# Patient Record
Sex: Female | Born: 1955 | Race: White | Hispanic: No | Marital: Married | Smoking: Never smoker
Health system: Southern US, Community
[De-identification: ages and names within clinical notes are randomized; demographics above are authoritative.]

---

## 2000-08-28 ENCOUNTER — Other Ambulatory Visit: Admission: RE | Admit: 2000-08-28 | Discharge: 2000-08-28 | Payer: Self-pay | Admitting: Internal Medicine

## 2000-09-02 ENCOUNTER — Ambulatory Visit (HOSPITAL_COMMUNITY): Admission: RE | Admit: 2000-09-02 | Discharge: 2000-09-02 | Payer: Self-pay | Admitting: Internal Medicine

## 2000-09-02 ENCOUNTER — Encounter: Payer: Self-pay | Admitting: Internal Medicine

## 2001-12-29 ENCOUNTER — Encounter: Payer: Self-pay | Admitting: Internal Medicine

## 2001-12-29 ENCOUNTER — Encounter: Admission: RE | Admit: 2001-12-29 | Discharge: 2001-12-29 | Payer: Self-pay | Admitting: Internal Medicine

## 2002-04-01 ENCOUNTER — Other Ambulatory Visit: Admission: RE | Admit: 2002-04-01 | Discharge: 2002-04-01 | Payer: Self-pay | Admitting: Internal Medicine

## 2002-07-09 ENCOUNTER — Ambulatory Visit (HOSPITAL_COMMUNITY): Admission: RE | Admit: 2002-07-09 | Discharge: 2002-07-09 | Payer: Self-pay | Admitting: Gastroenterology

## 2003-02-11 ENCOUNTER — Ambulatory Visit (HOSPITAL_COMMUNITY): Admission: RE | Admit: 2003-02-11 | Discharge: 2003-02-11 | Payer: Self-pay | Admitting: Internal Medicine

## 2004-01-09 ENCOUNTER — Other Ambulatory Visit: Admission: RE | Admit: 2004-01-09 | Discharge: 2004-01-09 | Payer: Self-pay | Admitting: Internal Medicine

## 2004-04-03 ENCOUNTER — Encounter (INDEPENDENT_AMBULATORY_CARE_PROVIDER_SITE_OTHER): Payer: Self-pay | Admitting: Specialist

## 2004-04-03 ENCOUNTER — Ambulatory Visit (HOSPITAL_COMMUNITY): Admission: RE | Admit: 2004-04-03 | Discharge: 2004-04-03 | Payer: Self-pay | Admitting: Obstetrics and Gynecology

## 2004-05-01 ENCOUNTER — Ambulatory Visit (HOSPITAL_COMMUNITY): Admission: RE | Admit: 2004-05-01 | Discharge: 2004-05-01 | Payer: Self-pay | Admitting: Internal Medicine

## 2005-06-03 ENCOUNTER — Ambulatory Visit (HOSPITAL_COMMUNITY): Admission: RE | Admit: 2005-06-03 | Discharge: 2005-06-03 | Payer: Self-pay | Admitting: Internal Medicine

## 2006-01-28 ENCOUNTER — Other Ambulatory Visit: Admission: RE | Admit: 2006-01-28 | Discharge: 2006-01-28 | Payer: Self-pay | Admitting: Family Medicine

## 2006-06-23 ENCOUNTER — Ambulatory Visit (HOSPITAL_COMMUNITY): Admission: RE | Admit: 2006-06-23 | Discharge: 2006-06-23 | Payer: Self-pay | Admitting: Family Medicine

## 2007-12-14 ENCOUNTER — Encounter: Admission: RE | Admit: 2007-12-14 | Discharge: 2007-12-14 | Payer: Self-pay | Admitting: Family Medicine

## 2009-02-07 ENCOUNTER — Encounter: Admission: RE | Admit: 2009-02-07 | Discharge: 2009-02-07 | Payer: Self-pay | Admitting: Family Medicine

## 2009-07-17 ENCOUNTER — Other Ambulatory Visit: Admission: RE | Admit: 2009-07-17 | Discharge: 2009-07-17 | Payer: Self-pay | Admitting: Family Medicine

## 2010-07-27 NOTE — Op Note (Signed)
Rhonda Molina, Rhonda Molina                 ACCOUNT NO.:  [id number]   MEDICAL RECORD NO.:  [id number]          PATIENT TYPE:  AMB   LOCATION:  SDC                           FACILITY:  [facility]   PHYSICIAN:  Charles A. Delcambre, MDDATE OF BIRTH:  February 15, 1956   DATE OF PROCEDURE:  04/03/2004  DATE OF DISCHARGE:                                 OPERATIVE REPORT   PREOPERATIVE DIAGNOSES:  1.  Menorrhagia.  2.  Endometrial polyps.   POSTOPERATIVE DIAGNOSES:  1.  Menorrhagia.  2.  Endometrial polyps.   PROCEDURES:  1.  Paracervical block.  2.  Hysteroscopy.  3.  Dilation and curettage.  4.  Polypectomy.   SURGEON:  Charles A. Sydnee Cabal, M.D.   ASSISTANT:  None.   COMPLICATIONS:  None.   ESTIMATED BLOOD LOSS:  Less than 25 cc.   ANESTHESIA:  Monitored anesthesia care with IV sedation.   SPECIMENS:  1.  Endometrial polyp.  2.  Endometrial curettings.   INSTRUMENT, SPONGE, AND NEEDLE COUNT:  Correct x 2.   OPERATIVE FINDINGS:  Fundal polyp.   SORBITOL LOSS DURING THE PROCEDURE:  20 cc.   Patient to PACU stable.   DESCRIPTION OF PROCEDURE:  The patient was taken to the operating room and  placed in the supine position.  Sedation by IV was undertaken without  difficulty.  A weighted speculum was placed in the vagina.  A tenaculum was  placed on the anterior lip of the cervix.  0.25% Marcaine a total of 20 cc  divided evenly at 4 and 7 o'clock was placed.  No evidence of intravascular  injection was noted.  A sound was noted to 10 cm.  No evidence of  perforation  was noted.  Hagar dilators were used to dilate enough to pass a 5 mm  hysteroscope.  Operative findings were noted.  Polyp forceps were used to  remove the polyp without difficulty.  Generalized curettings were done with  a small banjo curet.  The procedure was terminated.  The patient was taken  to the PACU with physician in attendance.      CAD/MEDQ  D:  04/03/2004  T:  04/03/2004  Job:  [id number]

## 2010-07-27 NOTE — H&P (Signed)
Rhonda Molina, WILLETTS                 ACCOUNT NO.:  [id number]   MEDICAL RECORD NO.:  [id number]          PATIENT TYPE:  AMB   LOCATION:  SDC                           FACILITY:  [facility]   PHYSICIAN:  Charles A. Delcambre, MDDATE OF BIRTH:  1956/01/06   DATE OF ADMISSION:  04/03/2004  DATE OF DISCHARGE:                                HISTORY & PHYSICAL   HISTORY OF PRESENT ILLNESS:  This patient is to be admitted on April 03, 2004, for hysteroscopy and D&C for endometrial polyps to same-day surgery.  She is a 55 year old, para 2-0-0-2, with irregular periods, heavy passage of  clots and heavy bleeding for two months.  Endometrial biopsy was  proliferative endometrium.  Sonohysterogram showed multiple endometrial  defects consistent with multiple endometrial polyps, 12 x 7 mm, 8 mm and 9  mm.  Thick endometrium was noted.  On the anterior 4.5 mm and posterior 4.1  mm defects were noted.   PAST MEDICAL HISTORY:  Migraine headaches.   PAST SURGICAL HISTORY:  Knee surgery.   MEDICATIONS:  Aleve and Excedrin for migraines.   ALLERGIES:  No known drug allergies.   SOCIAL HISTORY:  Occasional social alcohol.  No tobacco or drug use.  STD  exposure in the past.  Married.  Monogamous relationship with her husband.   FAMILY HISTORY:  Father deceased at 65 of heart disease and diabetes.  Mother 72 with colon cancer onset in her 9s.  A brother, 17, in good  health. Otherwise negative.   REVIEW OF SYSTEMS:  Heavy bleeding and irregular menses as noted.  No fever,  chills, rashes, lesions, headaches, dizziness, seasonal allergies, chest  pain, shortness of breath, wheezing, diarrhea, constipation, bleeding,  melena, hematochezia, urgency, frequency, dysuria, incontinence, hematuria,  hematuria, galactorrhea or emotional changes.   PHYSICAL EXAMINATION:  GENERAL APPEARANCE:  Alert and oriented x 3.  No  distress.  VITAL SIGNS:  Blood pressure 110/80, respirations 16, heart rate 80.  WEIGHT:   207 pounds.  HEENT:  Grossly within normal limits.  NECK:  Supple without thyromegaly or adenopathy.  LUNGS:  Clear bilaterally.  HEART:  Regular rate and rhythm without murmur, rub or gallop.  BREASTS:  Symmetrical on exam.  Negative PCP within the last six months.  ABDOMEN:  Soft, flat and nontender.  PELVIC:  Normal external female genitalia.  Bartholin, urethral and Skene's  glands within normal limits.  Vault without discharge or lesions.  Sound on  biopsy was 9 cm.  The uterus was otherwise not enlarged.  Adnexa nontender  without masses bilaterally.  On ultrasound, the ovaries appeared normal  bilaterally.   ASSESSMENT:  1.  Menorrhagia.  2.  Endometrial polyps.   PLAN:  Hysteroscopy and D&C.  The patient was aware of the risks of uterine  perforation, bowel and bladder damage, blood product risks, including  hepatitis and HIV, transfusion risks, sorbitol fluid overload, electrolyte  imbalance and anesthesia risks in general.  All questions were answered.  She gives informed consent.  She will remain NPO past midnight.  Preoperative serum hCG and CBC will be done.  Of note, birth control method  is vasectomy.  All questions were answered.  We will proceed as outlined.      CAD/MEDQ  D:  03/27/2004  T:  03/27/2004  Job:  [id number]

## 2010-07-27 NOTE — Op Note (Signed)
   Rhonda Molina, Rhonda Molina                           ACCOUNT NO.:  [id number]   MEDICAL RECORD NO.:  [id number]                   PATIENT TYPE:  AMB   LOCATION:  ENDO                                 FACILITY:  [facility]   PHYSICIAN:  James L. Malon Kindle., M.D.          DATE OF BIRTH:  1955/08/26   DATE OF PROCEDURE:  07/09/2002  DATE OF DISCHARGE:                                 OPERATIVE REPORT   PROCEDURE:  Colonoscopy.   MEDICATIONS:  Fentanyl 70 mcg, Versed 10 mg IV.   INSTRUMENT USED:  Olympus pediatric scope.   INDICATIONS FOR PROCEDURE:  A 55 year old whose mother developed colon  cancer in her early 75's.  The patient has had screening colonoscopies in  the past, but since it is now less than 10 years age difference since her  mother developed cancer, we are doing a baseline screening colonoscopy.   DESCRIPTION OF PROCEDURE:  The procedure had been explained to the procedure  and consent obtained. With the patient in the left lateral decubitus  position, the Olympus scope was inserted and advanced.  The Olympus  pediatric scope was used and we were able to advance to the cecum using  abdominal pressure and position change.  The ileocecal valve was seen and  entered for a short distance.  Terminal ileum was normal. The cecum,  ascending colon, hepatic flexure, transverse, descending, and sigmoid colon  were all seen well and no polyps were seen. There was no significant  diverticulosis. The scope was withdrawn to the rectum and the rectum was  free of polyps.  There were internal hemorrhoids. The scope was withdrawn  and the patient tolerated the procedure well.   ASSESSMENT:  1. Normal colonoscopy without polyps.  2. Internal hemorrhoids.   PLAN:  Will recommend repeating in five years with yearly hemoccults and  hemorrhoid instruction sheet.                                               James L. Malon Kindle., M.D.    Waldron Session  D:  07/09/2002  T:  07/09/2002  Job:   [id number]   cc:   Marcene Duos, M.D.  [address] [place]  [place] [address]  Fax: [phone]

## 2010-12-05 ENCOUNTER — Other Ambulatory Visit: Payer: Self-pay | Admitting: Family Medicine

## 2010-12-05 DIAGNOSIS — Z1231 Encounter for screening mammogram for malignant neoplasm of breast: Secondary | ICD-10-CM

## 2010-12-17 ENCOUNTER — Ambulatory Visit
Admission: RE | Admit: 2010-12-17 | Discharge: 2010-12-17 | Disposition: A | Discharge status: Home / Self Care | Payer: 59 | Source: Ambulatory Visit | Attending: Family Medicine | Admitting: Family Medicine

## 2010-12-17 DIAGNOSIS — Z1231 Encounter for screening mammogram for malignant neoplasm of breast: Secondary | ICD-10-CM

## 2011-11-29 ENCOUNTER — Other Ambulatory Visit: Payer: Self-pay | Admitting: Family Medicine

## 2011-11-29 DIAGNOSIS — Z1231 Encounter for screening mammogram for malignant neoplasm of breast: Secondary | ICD-10-CM

## 2011-12-24 ENCOUNTER — Ambulatory Visit
Admission: RE | Admit: 2011-12-24 | Discharge: 2011-12-24 | Disposition: A | Discharge status: Home / Self Care | Payer: 59 | Source: Ambulatory Visit | Attending: Family Medicine | Admitting: Family Medicine

## 2011-12-24 DIAGNOSIS — Z1231 Encounter for screening mammogram for malignant neoplasm of breast: Secondary | ICD-10-CM

## 2013-12-17 ENCOUNTER — Other Ambulatory Visit: Payer: Self-pay

## 2013-12-17 DIAGNOSIS — Z1239 Encounter for other screening for malignant neoplasm of breast: Secondary | ICD-10-CM

## 2013-12-28 ENCOUNTER — Ambulatory Visit
Admission: RE | Admit: 2013-12-28 | Discharge: 2013-12-28 | Disposition: A | Discharge status: Home / Self Care | Payer: 59 | Source: Ambulatory Visit

## 2013-12-28 DIAGNOSIS — Z1239 Encounter for other screening for malignant neoplasm of breast: Secondary | ICD-10-CM

## 2014-01-03 ENCOUNTER — Other Ambulatory Visit: Payer: Self-pay | Admitting: Family Medicine

## 2014-01-03 DIAGNOSIS — R928 Other abnormal and inconclusive findings on diagnostic imaging of breast: Secondary | ICD-10-CM

## 2014-01-17 ENCOUNTER — Ambulatory Visit
Admission: RE | Admit: 2014-01-17 | Discharge: 2014-01-17 | Disposition: A | Discharge status: Home / Self Care | Payer: 59 | Source: Ambulatory Visit | Attending: Family Medicine | Admitting: Family Medicine

## 2014-01-17 DIAGNOSIS — R928 Other abnormal and inconclusive findings on diagnostic imaging of breast: Secondary | ICD-10-CM

## 2014-12-13 ENCOUNTER — Other Ambulatory Visit: Payer: Self-pay

## 2014-12-13 DIAGNOSIS — Z1231 Encounter for screening mammogram for malignant neoplasm of breast: Secondary | ICD-10-CM

## 2014-12-30 ENCOUNTER — Ambulatory Visit
Admission: RE | Admit: 2014-12-30 | Discharge: 2014-12-30 | Disposition: A | Discharge status: Home / Self Care | Payer: Commercial Managed Care - HMO | Source: Ambulatory Visit

## 2014-12-30 DIAGNOSIS — Z1231 Encounter for screening mammogram for malignant neoplasm of breast: Secondary | ICD-10-CM

## 2015-01-03 ENCOUNTER — Ambulatory Visit (HOSPITAL_COMMUNITY)
Admission: RE | Admit: 2015-01-03 | Discharge: 2015-01-03 | Disposition: A | Discharge status: Home / Self Care | Payer: Commercial Managed Care - HMO | Source: Ambulatory Visit | Attending: Family Medicine | Admitting: Family Medicine

## 2015-01-03 ENCOUNTER — Other Ambulatory Visit (HOSPITAL_COMMUNITY): Payer: Self-pay | Admitting: Family Medicine

## 2015-01-03 DIAGNOSIS — M7989 Other specified soft tissue disorders: Secondary | ICD-10-CM

## 2015-01-03 DIAGNOSIS — M79604 Pain in right leg: Secondary | ICD-10-CM

## 2015-01-03 DIAGNOSIS — M79661 Pain in right lower leg: Secondary | ICD-10-CM | POA: Diagnosis present

## 2015-01-03 NOTE — Progress Notes (Addendum)
Preliminary results by tech - Right Lower ext duplex completed. Negative for deep and superficial vein thrombosis in the right lower extremity. A small Baker's cyst was noted in the right popliteal fossa. Left message with Dr. Chase CallerSun's office at 4.50 pm. Marilynne Halstedita Ryn Peine, BS, RDMS, RVT

## 2015-10-18 ENCOUNTER — Other Ambulatory Visit: Payer: Self-pay | Admitting: Family Medicine

## 2015-10-18 ENCOUNTER — Other Ambulatory Visit (HOSPITAL_COMMUNITY)
Admission: RE | Admit: 2015-10-18 | Discharge: 2015-10-18 | Disposition: A | Discharge status: Home / Self Care | Payer: Commercial Managed Care - HMO | Source: Ambulatory Visit | Attending: Family Medicine | Admitting: Family Medicine

## 2015-10-18 DIAGNOSIS — Z124 Encounter for screening for malignant neoplasm of cervix: Secondary | ICD-10-CM | POA: Insufficient documentation

## 2015-10-18 DIAGNOSIS — Z1151 Encounter for screening for human papillomavirus (HPV): Secondary | ICD-10-CM | POA: Diagnosis present

## 2015-10-19 LAB — CYTOLOGY - PAP

## 2015-12-28 ENCOUNTER — Other Ambulatory Visit: Payer: Self-pay | Admitting: Family Medicine

## 2015-12-28 DIAGNOSIS — Z1231 Encounter for screening mammogram for malignant neoplasm of breast: Secondary | ICD-10-CM

## 2016-01-11 ENCOUNTER — Ambulatory Visit
Admission: RE | Admit: 2016-01-11 | Discharge: 2016-01-11 | Disposition: A | Discharge status: Home / Self Care | Payer: Commercial Managed Care - HMO | Source: Ambulatory Visit | Attending: Family Medicine | Admitting: Family Medicine

## 2016-01-11 DIAGNOSIS — Z1231 Encounter for screening mammogram for malignant neoplasm of breast: Secondary | ICD-10-CM

## 2016-03-19 DIAGNOSIS — K644 Residual hemorrhoidal skin tags: Secondary | ICD-10-CM | POA: Diagnosis not present

## 2016-03-19 DIAGNOSIS — Z1211 Encounter for screening for malignant neoplasm of colon: Secondary | ICD-10-CM | POA: Diagnosis not present

## 2016-03-19 DIAGNOSIS — Z800 Family history of malignant neoplasm of digestive organs: Secondary | ICD-10-CM | POA: Diagnosis not present

## 2016-03-20 DIAGNOSIS — J209 Acute bronchitis, unspecified: Secondary | ICD-10-CM | POA: Diagnosis not present

## 2016-09-20 ENCOUNTER — Encounter: Payer: Self-pay | Admitting: Family Medicine

## 2016-09-20 ENCOUNTER — Ambulatory Visit (INDEPENDENT_AMBULATORY_CARE_PROVIDER_SITE_OTHER): Payer: 59 | Admitting: Family Medicine

## 2016-09-20 VITALS — BP 126/76 | Ht 67.0 in | Wt 200.0 lb

## 2016-09-20 DIAGNOSIS — R269 Unspecified abnormalities of gait and mobility: Secondary | ICD-10-CM | POA: Diagnosis not present

## 2016-09-20 DIAGNOSIS — M79672 Pain in left foot: Secondary | ICD-10-CM | POA: Diagnosis not present

## 2016-09-20 DIAGNOSIS — M79671 Pain in right foot: Secondary | ICD-10-CM

## 2016-09-20 DIAGNOSIS — M7742 Metatarsalgia, left foot: Secondary | ICD-10-CM | POA: Diagnosis not present

## 2016-09-20 DIAGNOSIS — M2040 Other hammer toe(s) (acquired), unspecified foot: Secondary | ICD-10-CM | POA: Diagnosis not present

## 2016-09-20 DIAGNOSIS — M7741 Metatarsalgia, right foot: Secondary | ICD-10-CM | POA: Diagnosis not present

## 2016-09-20 NOTE — Progress Notes (Signed)
    CHIEF COMPLAINT / HPI: Bilateral foot pain long-standing. Biggest problem is left heel. Medial plantar surface pain with extended periods of walking standing etc. Had received some orthotics from a podiatrist a few years ago and is really helped but does have worn out. Pain is worse if she stands for more than about half an hour. Also pain with walking. Aching in nature 4-6 out of 10. Worse on the left foot bilateral. The orthotics helped. Rest helps.  REVIEW OF SYSTEMS:  No unusual weight change, no fever or chills. No skin rash on the feet. Has noted no specific numbness or tingling in the feet. No other unusual arthralgias or myalgias.  OBJECTIVE:  Vital signs are reviewed.   GEN.: Well-developed female no acute distress FEET: She has hammertoe right great toe and little toe. Left second toe partially hammered area beginning bunion formation bilateral or striae right greater than left. Wide forefoot with transverse arch loss. He'll tender to palpation at the origin the plantar fascia bilaterally. GAIT: Normal stride length. Out toeing on the left. Of pronation on the right.  Patient was fitted for a : standard, cushioned, semi-rigid orthotic. The orthotic was heated, placed on the orthotic stand. The patient was positioned in subtalar neutral position and 10 degrees of ankle dorsiflexion in a weight bearing stance on the heated orthotic blank After completion of molding, a stable base was applied to the orthotic blank. The blank was ground to a stable position for weight bearing. Blank: Red foam Base: White F 4 Posting: None   Face to face time spent in evaluation of foot pathology,gait abnormalities,counseling regarding foot / ankle and gait issues,effect of foot on on extremities,  discussion of therapeutic options, measurement and manufacture of custom molded orthotic was 45 minutes.   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 She also has some contribution of plantar fasciitis on the left heel. Bilaterally she has several hammertoes. To address these we made custom molded orthotics as above. At some point she may need addition of metatarsal pads as she has significant metatarsal flattening related to her transverse arch loss. She'll follow-up when necessary.

## 2016-12-09 ENCOUNTER — Other Ambulatory Visit: Payer: Self-pay | Admitting: Family Medicine

## 2016-12-09 DIAGNOSIS — Z1231 Encounter for screening mammogram for malignant neoplasm of breast: Secondary | ICD-10-CM

## 2016-12-18 DIAGNOSIS — Z23 Encounter for immunization: Secondary | ICD-10-CM | POA: Diagnosis not present

## 2017-01-13 ENCOUNTER — Ambulatory Visit
Admission: RE | Admit: 2017-01-13 | Discharge: 2017-01-13 | Disposition: A | Discharge status: Home / Self Care | Payer: 59 | Source: Ambulatory Visit | Attending: Family Medicine | Admitting: Family Medicine

## 2017-01-13 DIAGNOSIS — Z1231 Encounter for screening mammogram for malignant neoplasm of breast: Secondary | ICD-10-CM

## 2017-04-28 DIAGNOSIS — Z13220 Encounter for screening for lipoid disorders: Secondary | ICD-10-CM | POA: Diagnosis not present

## 2017-04-28 DIAGNOSIS — Z0000 Encounter for general adult medical examination without abnormal findings: Secondary | ICD-10-CM | POA: Diagnosis not present

## 2017-04-28 DIAGNOSIS — K219 Gastro-esophageal reflux disease without esophagitis: Secondary | ICD-10-CM | POA: Diagnosis not present

## 2017-04-28 DIAGNOSIS — E559 Vitamin D deficiency, unspecified: Secondary | ICD-10-CM | POA: Diagnosis not present

## 2017-04-28 DIAGNOSIS — Z79899 Other long term (current) drug therapy: Secondary | ICD-10-CM | POA: Diagnosis not present

## 2017-06-17 DIAGNOSIS — M8588 Other specified disorders of bone density and structure, other site: Secondary | ICD-10-CM | POA: Diagnosis not present

## 2017-07-17 DIAGNOSIS — S70369A Insect bite (nonvenomous), unspecified thigh, initial encounter: Secondary | ICD-10-CM | POA: Diagnosis not present

## 2018-01-01 DIAGNOSIS — Z23 Encounter for immunization: Secondary | ICD-10-CM | POA: Diagnosis not present

## 2018-01-07 ENCOUNTER — Other Ambulatory Visit: Payer: Self-pay | Admitting: Family Medicine

## 2018-01-07 DIAGNOSIS — Z1231 Encounter for screening mammogram for malignant neoplasm of breast: Secondary | ICD-10-CM

## 2018-02-18 ENCOUNTER — Ambulatory Visit
Admission: RE | Admit: 2018-02-18 | Discharge: 2018-02-18 | Disposition: A | Discharge status: Home / Self Care | Payer: 59 | Source: Ambulatory Visit | Attending: Family Medicine | Admitting: Family Medicine

## 2018-02-18 DIAGNOSIS — Z1231 Encounter for screening mammogram for malignant neoplasm of breast: Secondary | ICD-10-CM | POA: Diagnosis not present

## 2018-04-30 DIAGNOSIS — K219 Gastro-esophageal reflux disease without esophagitis: Secondary | ICD-10-CM | POA: Diagnosis not present

## 2018-04-30 DIAGNOSIS — Z79899 Other long term (current) drug therapy: Secondary | ICD-10-CM | POA: Diagnosis not present

## 2018-04-30 DIAGNOSIS — Z1159 Encounter for screening for other viral diseases: Secondary | ICD-10-CM | POA: Diagnosis not present

## 2018-04-30 DIAGNOSIS — Z0000 Encounter for general adult medical examination without abnormal findings: Secondary | ICD-10-CM | POA: Diagnosis not present

## 2018-04-30 DIAGNOSIS — E559 Vitamin D deficiency, unspecified: Secondary | ICD-10-CM | POA: Diagnosis not present

## 2018-04-30 DIAGNOSIS — Z13220 Encounter for screening for lipoid disorders: Secondary | ICD-10-CM | POA: Diagnosis not present

## 2018-06-04 DIAGNOSIS — H6993 Unspecified Eustachian tube disorder, bilateral: Secondary | ICD-10-CM | POA: Diagnosis not present

## 2018-12-24 DIAGNOSIS — Z23 Encounter for immunization: Secondary | ICD-10-CM | POA: Diagnosis not present

## 2019-01-04 ENCOUNTER — Encounter (INDEPENDENT_AMBULATORY_CARE_PROVIDER_SITE_OTHER): Payer: Self-pay

## 2019-01-28 ENCOUNTER — Other Ambulatory Visit: Payer: Self-pay | Admitting: Family Medicine

## 2019-01-28 DIAGNOSIS — Z1231 Encounter for screening mammogram for malignant neoplasm of breast: Secondary | ICD-10-CM

## 2019-03-24 ENCOUNTER — Other Ambulatory Visit: Payer: Self-pay

## 2019-03-24 ENCOUNTER — Ambulatory Visit: Payer: 59

## 2019-03-24 ENCOUNTER — Ambulatory Visit
Admission: RE | Admit: 2019-03-24 | Discharge: 2019-03-24 | Disposition: A | Discharge status: Home / Self Care | Payer: BC Managed Care – PPO | Source: Ambulatory Visit | Attending: Family Medicine | Admitting: Family Medicine

## 2019-03-24 DIAGNOSIS — Z1231 Encounter for screening mammogram for malignant neoplasm of breast: Secondary | ICD-10-CM

## 2019-05-03 DIAGNOSIS — Z0000 Encounter for general adult medical examination without abnormal findings: Secondary | ICD-10-CM | POA: Diagnosis not present

## 2019-05-03 DIAGNOSIS — Z13220 Encounter for screening for lipoid disorders: Secondary | ICD-10-CM | POA: Diagnosis not present

## 2019-05-03 DIAGNOSIS — Z23 Encounter for immunization: Secondary | ICD-10-CM | POA: Diagnosis not present

## 2019-05-03 DIAGNOSIS — E559 Vitamin D deficiency, unspecified: Secondary | ICD-10-CM | POA: Diagnosis not present

## 2019-12-15 DIAGNOSIS — Z23 Encounter for immunization: Secondary | ICD-10-CM | POA: Diagnosis not present

## 2020-03-17 ENCOUNTER — Other Ambulatory Visit: Payer: Self-pay | Admitting: Family Medicine

## 2020-03-17 DIAGNOSIS — Z1231 Encounter for screening mammogram for malignant neoplasm of breast: Secondary | ICD-10-CM

## 2020-04-26 ENCOUNTER — Ambulatory Visit: Payer: BC Managed Care – PPO

## 2020-05-03 ENCOUNTER — Other Ambulatory Visit (HOSPITAL_COMMUNITY)
Admission: RE | Admit: 2020-05-03 | Discharge: 2020-05-03 | Disposition: A | Discharge status: Home / Self Care | Payer: BC Managed Care – PPO | Source: Ambulatory Visit | Attending: Family Medicine | Admitting: Family Medicine

## 2020-05-03 ENCOUNTER — Other Ambulatory Visit: Payer: Self-pay | Admitting: Family Medicine

## 2020-05-03 DIAGNOSIS — E559 Vitamin D deficiency, unspecified: Secondary | ICD-10-CM | POA: Diagnosis not present

## 2020-05-03 DIAGNOSIS — Z0000 Encounter for general adult medical examination without abnormal findings: Secondary | ICD-10-CM | POA: Diagnosis not present

## 2020-05-03 DIAGNOSIS — Z124 Encounter for screening for malignant neoplasm of cervix: Secondary | ICD-10-CM | POA: Diagnosis not present

## 2020-05-03 DIAGNOSIS — M8580 Other specified disorders of bone density and structure, unspecified site: Secondary | ICD-10-CM

## 2020-05-03 DIAGNOSIS — Z13220 Encounter for screening for lipoid disorders: Secondary | ICD-10-CM | POA: Diagnosis not present

## 2020-05-05 LAB — CYTOLOGY - PAP
Comment: NEGATIVE
Diagnosis: NEGATIVE
High risk HPV: NEGATIVE

## 2020-05-25 ENCOUNTER — Other Ambulatory Visit: Payer: Self-pay

## 2020-05-25 ENCOUNTER — Ambulatory Visit
Admission: RE | Admit: 2020-05-25 | Discharge: 2020-05-25 | Disposition: A | Discharge status: Home / Self Care | Payer: BC Managed Care – PPO | Source: Ambulatory Visit | Attending: Family Medicine | Admitting: Family Medicine

## 2020-05-25 DIAGNOSIS — Z1231 Encounter for screening mammogram for malignant neoplasm of breast: Secondary | ICD-10-CM | POA: Diagnosis not present

## 2020-10-10 ENCOUNTER — Ambulatory Visit
Admission: RE | Admit: 2020-10-10 | Discharge: 2020-10-10 | Disposition: A | Discharge status: Home / Self Care | Payer: BC Managed Care – PPO | Source: Ambulatory Visit | Attending: Family Medicine | Admitting: Family Medicine

## 2020-10-10 ENCOUNTER — Other Ambulatory Visit: Payer: Self-pay

## 2020-10-10 DIAGNOSIS — M85832 Other specified disorders of bone density and structure, left forearm: Secondary | ICD-10-CM | POA: Diagnosis not present

## 2020-10-10 DIAGNOSIS — M810 Age-related osteoporosis without current pathological fracture: Secondary | ICD-10-CM | POA: Diagnosis not present

## 2020-10-10 DIAGNOSIS — M8580 Other specified disorders of bone density and structure, unspecified site: Secondary | ICD-10-CM

## 2020-10-10 DIAGNOSIS — M85852 Other specified disorders of bone density and structure, left thigh: Secondary | ICD-10-CM | POA: Diagnosis not present

## 2020-10-10 DIAGNOSIS — Z780 Asymptomatic menopausal state: Secondary | ICD-10-CM | POA: Diagnosis not present

## 2020-10-25 DIAGNOSIS — M810 Age-related osteoporosis without current pathological fracture: Secondary | ICD-10-CM | POA: Diagnosis not present

## 2020-10-25 DIAGNOSIS — U071 COVID-19: Secondary | ICD-10-CM | POA: Diagnosis not present

## 2020-12-27 DIAGNOSIS — Z23 Encounter for immunization: Secondary | ICD-10-CM | POA: Diagnosis not present

## 2021-02-27 DIAGNOSIS — H43811 Vitreous degeneration, right eye: Secondary | ICD-10-CM | POA: Diagnosis not present

## 2021-05-10 DIAGNOSIS — M810 Age-related osteoporosis without current pathological fracture: Secondary | ICD-10-CM | POA: Diagnosis not present

## 2021-05-10 DIAGNOSIS — Z0000 Encounter for general adult medical examination without abnormal findings: Secondary | ICD-10-CM | POA: Diagnosis not present

## 2021-05-10 DIAGNOSIS — Z13220 Encounter for screening for lipoid disorders: Secondary | ICD-10-CM | POA: Diagnosis not present

## 2021-05-10 DIAGNOSIS — E559 Vitamin D deficiency, unspecified: Secondary | ICD-10-CM | POA: Diagnosis not present

## 2021-05-11 ENCOUNTER — Other Ambulatory Visit: Payer: Self-pay | Admitting: Family Medicine

## 2021-05-11 DIAGNOSIS — Z1231 Encounter for screening mammogram for malignant neoplasm of breast: Secondary | ICD-10-CM

## 2021-05-28 ENCOUNTER — Ambulatory Visit
Admission: RE | Admit: 2021-05-28 | Discharge: 2021-05-28 | Disposition: A | Discharge status: Home / Self Care | Payer: BC Managed Care – PPO | Source: Ambulatory Visit | Attending: Family Medicine | Admitting: Family Medicine

## 2021-05-28 DIAGNOSIS — Z1231 Encounter for screening mammogram for malignant neoplasm of breast: Secondary | ICD-10-CM

## 2021-06-06 DIAGNOSIS — H43811 Vitreous degeneration, right eye: Secondary | ICD-10-CM | POA: Diagnosis not present

## 2021-10-19 DIAGNOSIS — M898X1 Other specified disorders of bone, shoulder: Secondary | ICD-10-CM | POA: Diagnosis not present

## 2021-11-01 DIAGNOSIS — Z1211 Encounter for screening for malignant neoplasm of colon: Secondary | ICD-10-CM | POA: Diagnosis not present

## 2021-11-01 DIAGNOSIS — K649 Unspecified hemorrhoids: Secondary | ICD-10-CM | POA: Diagnosis not present

## 2021-11-01 DIAGNOSIS — D122 Benign neoplasm of ascending colon: Secondary | ICD-10-CM | POA: Diagnosis not present

## 2021-11-01 DIAGNOSIS — Z800 Family history of malignant neoplasm of digestive organs: Secondary | ICD-10-CM | POA: Diagnosis not present

## 2021-11-01 DIAGNOSIS — K5730 Diverticulosis of large intestine without perforation or abscess without bleeding: Secondary | ICD-10-CM | POA: Diagnosis not present

## 2022-04-15 ENCOUNTER — Other Ambulatory Visit: Payer: Self-pay | Admitting: Family Medicine

## 2022-04-15 DIAGNOSIS — Z1231 Encounter for screening mammogram for malignant neoplasm of breast: Secondary | ICD-10-CM

## 2022-06-03 ENCOUNTER — Ambulatory Visit
Admission: RE | Admit: 2022-06-03 | Discharge: 2022-06-03 | Disposition: A | Discharge status: Home / Self Care | Payer: Medicare Other | Source: Ambulatory Visit | Attending: Family Medicine | Admitting: Family Medicine

## 2022-06-03 DIAGNOSIS — Z1231 Encounter for screening mammogram for malignant neoplasm of breast: Secondary | ICD-10-CM

## 2022-07-18 ENCOUNTER — Other Ambulatory Visit: Payer: Self-pay | Admitting: Family Medicine

## 2022-07-18 DIAGNOSIS — M810 Age-related osteoporosis without current pathological fracture: Secondary | ICD-10-CM

## 2022-07-31 IMAGING — MG MM DIGITAL SCREENING BILAT W/ TOMO AND CAD
8 series · 8 of 24 positions shown · non-contrast
Comparison: Previous exam(s).

ACR Breast Density Category a: The breast tissue is almost entirely
fatty.

CLINICAL DATA: Screening.

EXAM:
DIGITAL SCREENING BILATERAL MAMMOGRAM WITH TOMOSYNTHESIS AND CAD
TECHNIQUE: Bilateral screening digital craniocaudal and mediolateral oblique
mammograms were obtained. Bilateral screening digital breast
tomosynthesis was performed. The images were evaluated with
computer-aided detection.

[R MLO synth-2D]
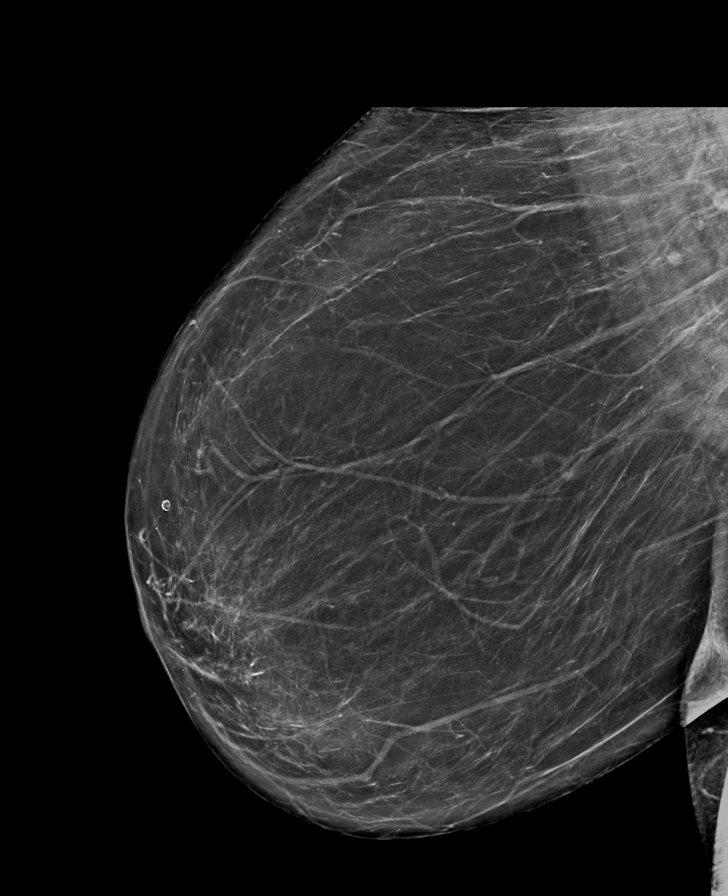

[L CC synth-2D]
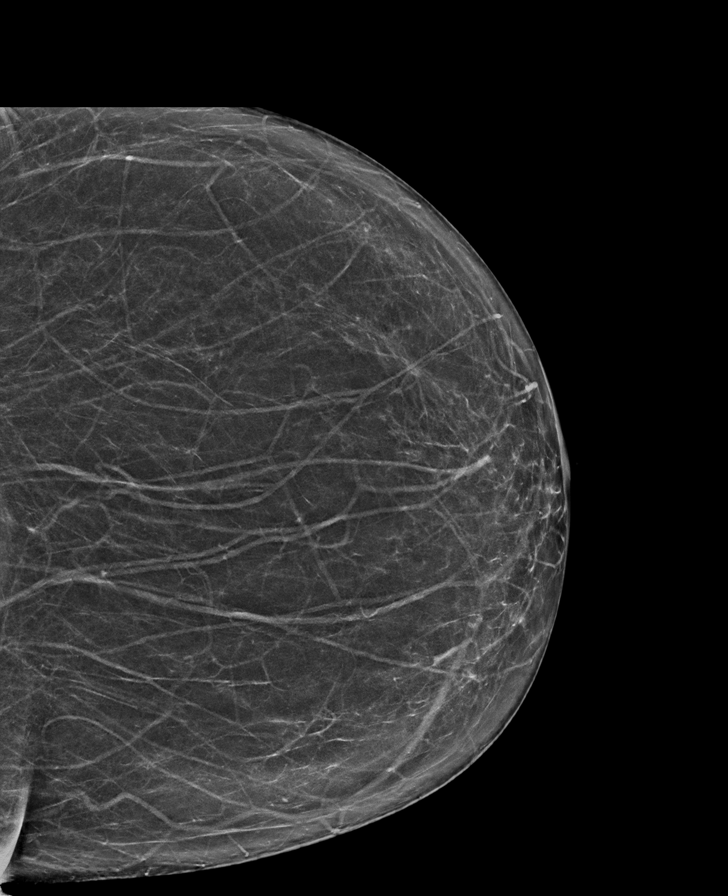

[R CC synth-2D]
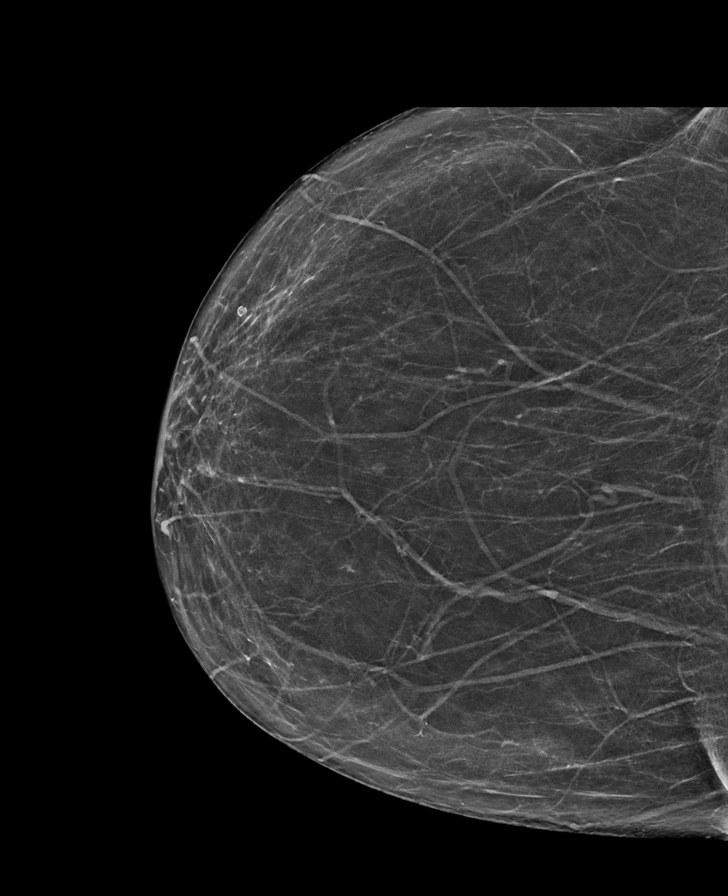

[L MLO synth-2D]
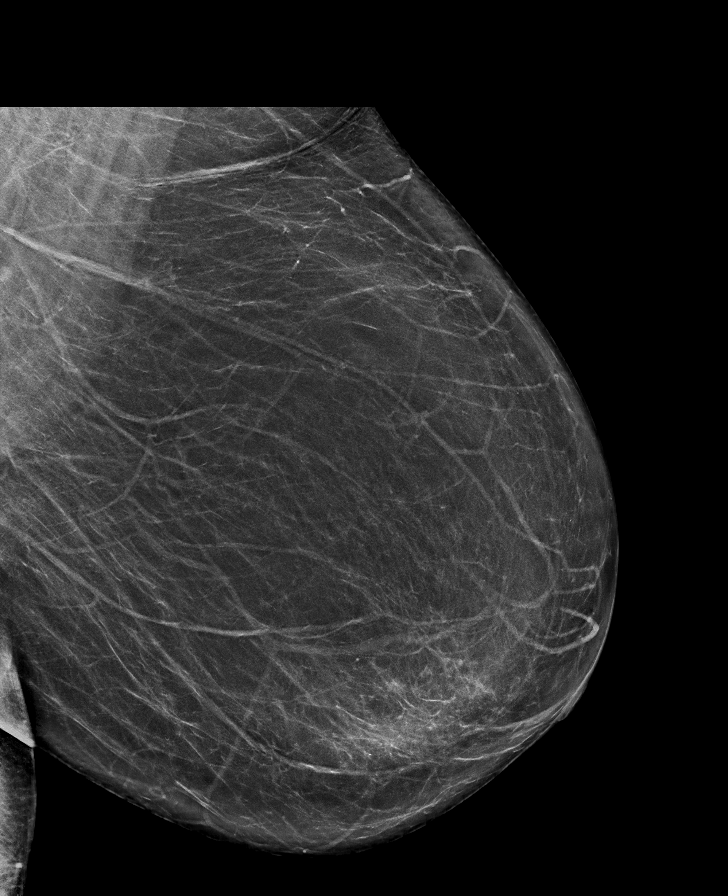

[R CC tomo · tomo slice 36/71.0]
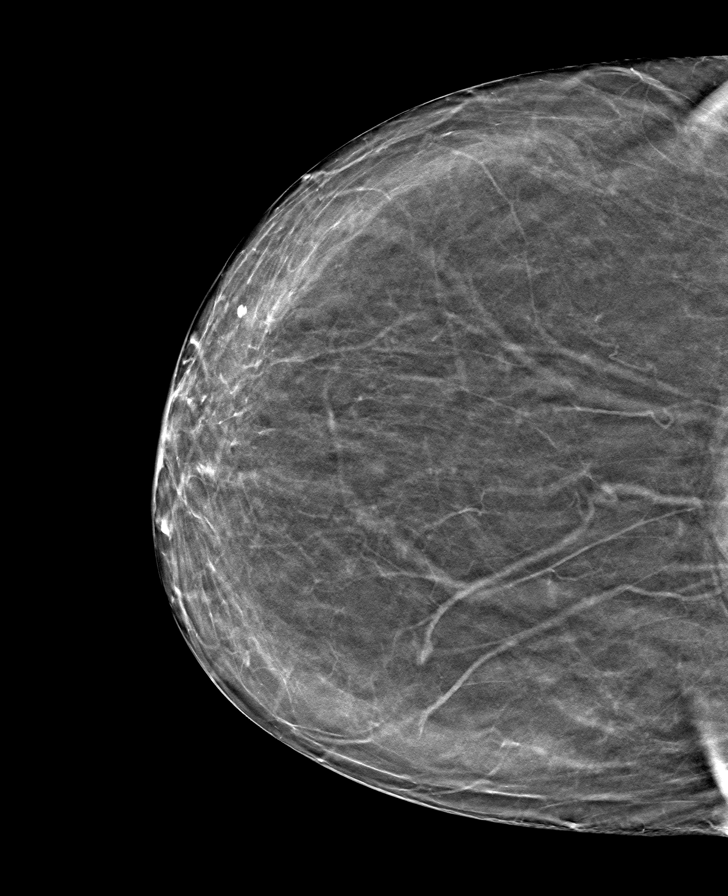

[L CC tomo · tomo slice 35/70.0]
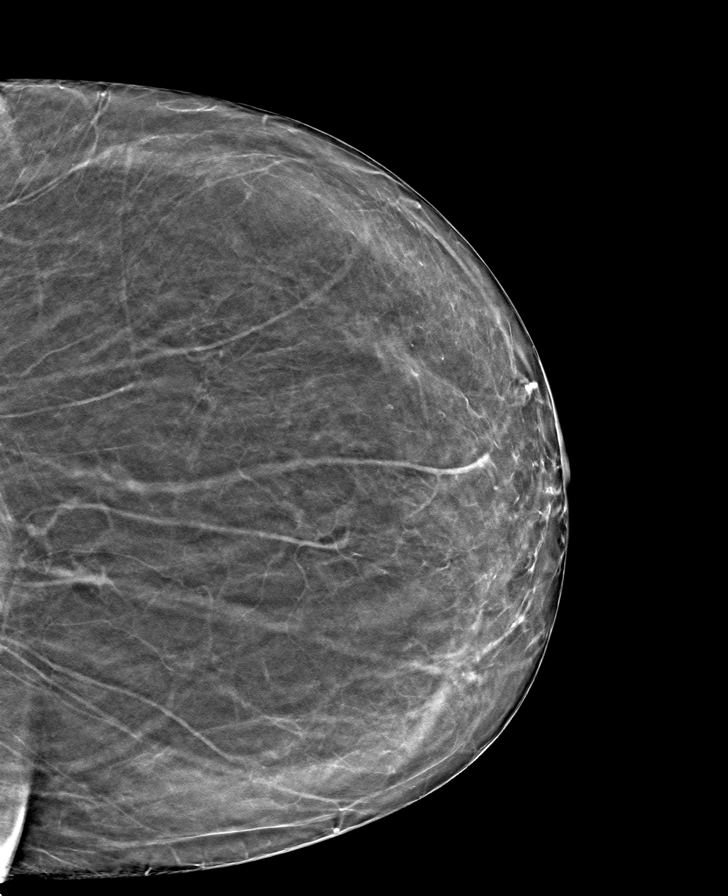

[R MLO tomo · tomo slice 41/80.0]
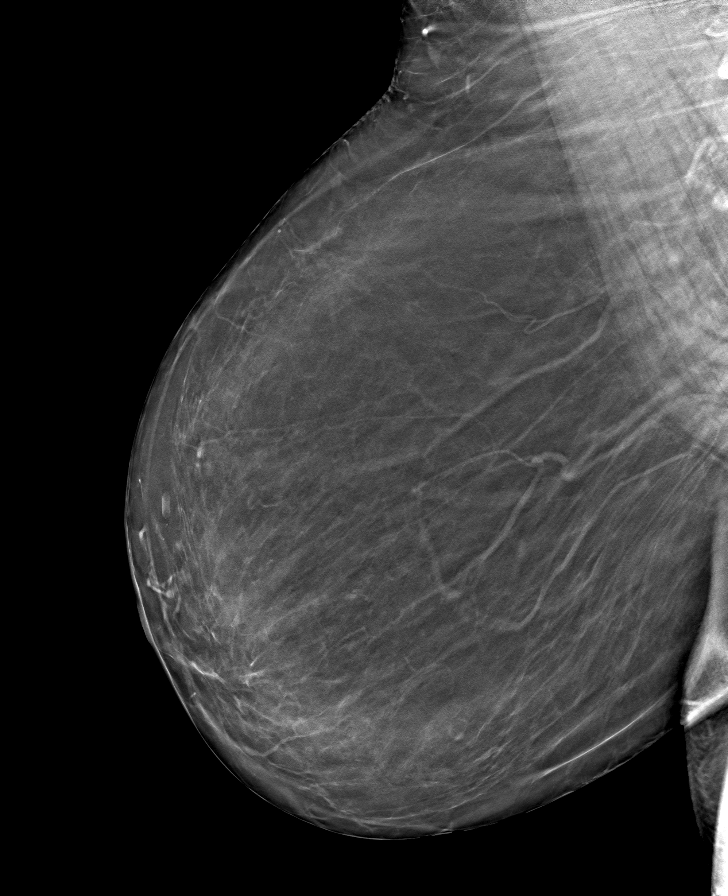

[L MLO tomo · tomo slice 41/82.0]
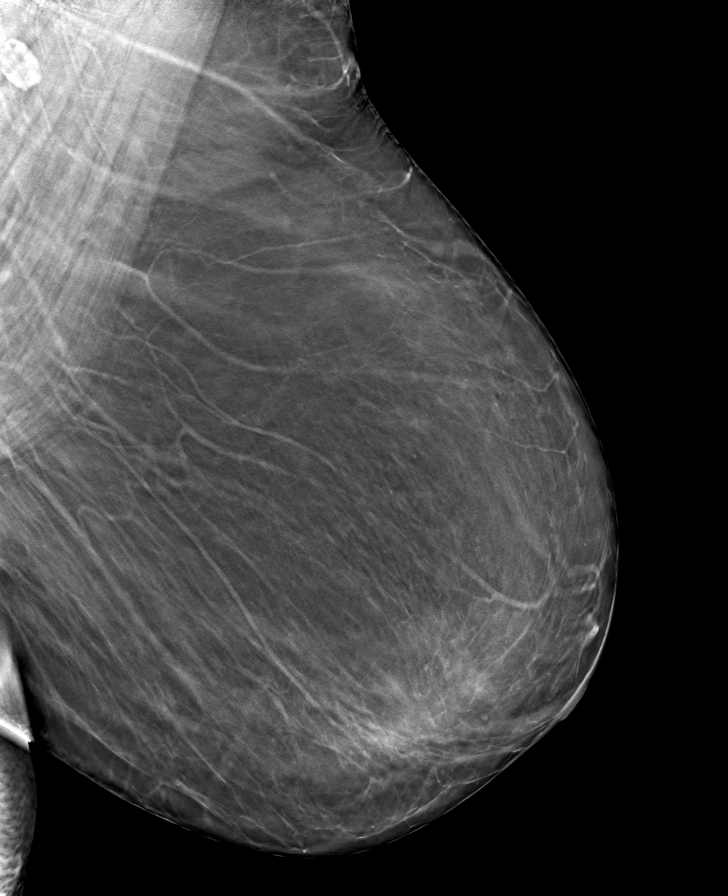

[8 of 24 positions shown; findings below may reference images not displayed]

FINDINGS: There are no findings suspicious for malignancy.
IMPRESSION: No mammographic evidence of malignancy. A result letter of this
screening mammogram will be mailed directly to the patient.

RECOMMENDATION:
Screening mammogram in one year. (Code:0E-3-N98)

BI-RADS CATEGORY  1: Negative.

## 2023-02-11 ENCOUNTER — Inpatient Hospital Stay
Admission: RE | Admit: 2023-02-11 | Discharge: 2023-02-11 | Disposition: A | Discharge status: Home / Self Care | Payer: Medicare Other | Source: Ambulatory Visit | Attending: Family Medicine | Admitting: Family Medicine

## 2023-02-11 DIAGNOSIS — M810 Age-related osteoporosis without current pathological fracture: Secondary | ICD-10-CM

## 2023-04-21 ENCOUNTER — Other Ambulatory Visit: Payer: Self-pay | Admitting: Family Medicine

## 2023-04-21 DIAGNOSIS — Z1231 Encounter for screening mammogram for malignant neoplasm of breast: Secondary | ICD-10-CM

## 2023-06-05 ENCOUNTER — Ambulatory Visit
Admission: RE | Admit: 2023-06-05 | Discharge: 2023-06-05 | Disposition: A | Discharge status: Home / Self Care | Payer: Medicare Other | Source: Ambulatory Visit | Attending: Family Medicine | Admitting: Family Medicine

## 2023-06-05 DIAGNOSIS — Z1231 Encounter for screening mammogram for malignant neoplasm of breast: Secondary | ICD-10-CM

## 2023-08-05 ENCOUNTER — Other Ambulatory Visit (HOSPITAL_COMMUNITY): Payer: Self-pay | Admitting: Family Medicine

## 2023-08-05 DIAGNOSIS — E785 Hyperlipidemia, unspecified: Secondary | ICD-10-CM

## 2023-09-04 ENCOUNTER — Ambulatory Visit (HOSPITAL_BASED_OUTPATIENT_CLINIC_OR_DEPARTMENT_OTHER)
Admission: RE | Admit: 2023-09-04 | Discharge: 2023-09-04 | Disposition: A | Discharge status: Home / Self Care | Payer: Self-pay | Source: Ambulatory Visit | Attending: Family Medicine | Admitting: Family Medicine

## 2023-09-04 DIAGNOSIS — E785 Hyperlipidemia, unspecified: Secondary | ICD-10-CM | POA: Insufficient documentation

## 2023-11-27 ENCOUNTER — Other Ambulatory Visit (HOSPITAL_BASED_OUTPATIENT_CLINIC_OR_DEPARTMENT_OTHER): Payer: Self-pay

## 2023-11-27 MED ORDER — FLUZONE HIGH-DOSE 0.5 ML IM SUSY
0.5000 mL | PREFILLED_SYRINGE | Freq: Once | INTRAMUSCULAR | 0 refills | Status: AC
Start: 2023-11-27 — End: 2023-11-28
  Filled 2023-11-27: qty 0.5, 1d supply, fill #0

## 2023-11-27 MED ORDER — COMIRNATY 30 MCG/0.3ML IM SUSY
0.3000 mL | PREFILLED_SYRINGE | Freq: Once | INTRAMUSCULAR | 0 refills | Status: AC
  Filled 2023-11-27: qty 0.3, 1d supply, fill #0
